# Patient Record
Sex: Male | Born: 1965 | Race: White | Hispanic: No | Marital: Single | Smoking: Never smoker
Health system: Southern US, Community
[De-identification: ages and names within clinical notes are randomized; demographics above are authoritative.]

## PROBLEM LIST (undated history)

## (undated) DIAGNOSIS — F4310 Post-traumatic stress disorder, unspecified: Secondary | ICD-10-CM

## (undated) HISTORY — DX: Post-traumatic stress disorder, unspecified: F43.10

## (undated) HISTORY — PX: OTHER SURGICAL HISTORY: SHX169

---

## 2007-04-09 ENCOUNTER — Emergency Department (HOSPITAL_COMMUNITY): Admission: EM | Admit: 2007-04-09 | Discharge: 2007-04-09 | Payer: Self-pay | Admitting: Emergency Medicine

## 2014-01-29 ENCOUNTER — Encounter: Payer: Self-pay | Admitting: Neurology

## 2014-02-02 ENCOUNTER — Ambulatory Visit (INDEPENDENT_AMBULATORY_CARE_PROVIDER_SITE_OTHER): Payer: BC Managed Care – PPO | Admitting: Neurology

## 2014-02-02 ENCOUNTER — Encounter: Payer: Self-pay | Admitting: Neurology

## 2014-02-02 ENCOUNTER — Encounter (INDEPENDENT_AMBULATORY_CARE_PROVIDER_SITE_OTHER): Payer: Self-pay

## 2014-02-02 VITALS — BP 129/93 | HR 73 | Ht 68.0 in | Wt 186.0 lb

## 2014-02-02 DIAGNOSIS — F070 Personality change due to known physiological condition: Secondary | ICD-10-CM

## 2014-02-02 DIAGNOSIS — F688 Other specified disorders of adult personality and behavior: Secondary | ICD-10-CM | POA: Insufficient documentation

## 2014-02-02 NOTE — Progress Notes (Signed)
Reason for visit: Personality change  Bryce Chen is a 48 y.o. male  History of present illness:  Bryce Chen is a 48 year old right-handed white male with a history of problems with holding down jobs throughout his life. The patient indicates that when he was in school, he was an average student, but he was able to get his work done. Throughout his life, the patient has had difficulty maintaining gainful employment. The patient will get a job, but he leaves it after several months, not seemingly understanding what his responsibilities are for that particular job. The patient will get focused on another issue, and not get critical tasks completed while working. The patient has recently lost another job 6 months ago, and he has been unemployed, and unable to find another job. The patient has had some underlying anxiety issues, and at times he has difficulty with sleeping. The patient denies any memory issues, or problems with recalling names for people. The patient denies any significant difficulties with directions while driving. The patient has not had any problems with math function. The patient reports no focal numbness or weakness of the face, arms, or legs. The patient has no weakness on the body. The patient denies any balance issues or problems controlling the bowels or the bladder. The patient denies fatigue or excessive daytime drowsiness. The father is present today, and he indicates that this issue with his behavior has been lifelong. The patient occasionally will have episodes of anger and rage. The patient is sent to this office for further evaluation.  Past Medical History  Diagnosis Date  . PTSD (post-traumatic stress disorder)     Past Surgical History  Procedure Laterality Date  . None      Family History  Problem Relation Age of Onset  . Cancer - Other Mother     ovarian  . Cancer - Other Father     skin cancer  . Heart failure Sister     Social history:   reports that he has never smoked. He has never used smokeless tobacco. He reports that he does not drink alcohol or use illicit drugs.  Medications:  No current outpatient prescriptions on file prior to visit.   No current facility-administered medications on file prior to visit.     No Known Allergies  ROS:  Out of a complete 14 system review of symptoms, the patient complains only of the following symptoms, and all other reviewed systems are negative.  Skin rash Blurred vision Skin sensitivity Confusion, headache Depression  Blood pressure 129/93, pulse 73, height 5\' 8"  (1.727 m), weight 186 lb (84.369 kg).  Physical Exam  General: The patient is alert and cooperative at the time of the examination.  Eyes: Pupils are equal, round, and reactive to light. Discs are flat bilaterally.  Neck: The neck is supple, no carotid bruits are noted.  Respiratory: The respiratory examination is clear.  Cardiovascular: The cardiovascular examination reveals a regular rate and rhythm, no obvious murmurs or rubs are noted.  Skin: Extremities are without significant edema.  Neurologic Exam  Mental status: The patient is alert and oriented x 3 at the time of the examination. The patient has apparent normal recent and remote memory, with an apparently normal attention span and concentration ability. Mini-Mental status examination done today shows a total score of 29/30.  Cranial nerves: Facial symmetry is present. There is good sensation of the face to pinprick and soft touch bilaterally. The strength of the facial muscles and the  muscles to head turning and shoulder shrug are normal bilaterally. Speech is well enunciated, no aphasia or dysarthria is noted. Extraocular movements are full. Visual fields are full. The tongue is midline, and the patient has symmetric elevation of the soft palate. No obvious hearing deficits are noted.  Motor: The motor testing reveals 5 over 5 strength of all 4  extremities. Good symmetric motor tone is noted throughout.  Sensory: Sensory testing is intact to pinprick, soft touch, vibration sensation, and position sense on all 4 extremities. No evidence of extinction is noted.  Coordination: Cerebellar testing reveals good finger-nose-finger and heel-to-shin bilaterally.  Gait and station: Gait is normal. Tandem gait is normal. Romberg is negative. No drift is seen.  Reflexes: Deep tendon reflexes are symmetric and normal bilaterally. Toes are downgoing bilaterally.   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 It appears that the patient is unable to focus on critical tasks at work, getting side tracked on other issues. The patient may have a very mild form of Asperger syndrome, but the possibility of ADD needs to be considered. The patient appears to have a flat affect, and some underlying depression issues must be considered. I am not clear that a MRI of the brain is indicated given the lifelong, static features of this problem. The patient will be sent for neuropsychological evaluation to further delineate any underlying cognitive issues or executive function disorders.  Marlan Palau. Keith Willis MD 02/02/2014 7:59 PM  [facility] [address] [place], [place] [id number]  Phone [phone] Fax [phone]

## 2014-04-27 DIAGNOSIS — R41844 Frontal lobe and executive function deficit: Secondary | ICD-10-CM

## 2014-05-04 DIAGNOSIS — R41844 Frontal lobe and executive function deficit: Secondary | ICD-10-CM | POA: Diagnosis not present

## 2014-05-28 ENCOUNTER — Telehealth: Payer: Self-pay | Admitting: Neurology

## 2014-05-28 NOTE — Telephone Encounter (Signed)
The neuropsychological testing as been done, showing evidence of nonverbal learning disorder. He was given the name of a psychologist to contact the help with therapy. I called the patient. It appears that DR. Zelson has already discussed the results of the neuropsychological evaluation with him. Hopefully, he'll contact me psychologist that he was referred to, and initiate treatment.

## 2015-01-26 ENCOUNTER — Other Ambulatory Visit: Payer: Self-pay | Admitting: Internal Medicine

## 2015-01-26 DIAGNOSIS — R1310 Dysphagia, unspecified: Secondary | ICD-10-CM

## 2015-02-02 ENCOUNTER — Ambulatory Visit
Admission: RE | Admit: 2015-02-02 | Discharge: 2015-02-02 | Disposition: A | Discharge status: Home / Self Care | Payer: BLUE CROSS/BLUE SHIELD | Source: Ambulatory Visit | Attending: Internal Medicine | Admitting: Internal Medicine

## 2015-02-02 ENCOUNTER — Encounter (INDEPENDENT_AMBULATORY_CARE_PROVIDER_SITE_OTHER): Payer: Self-pay

## 2015-02-02 DIAGNOSIS — R1310 Dysphagia, unspecified: Secondary | ICD-10-CM

## 2016-04-25 IMAGING — RF DG UGI W/ HIGH DENSITY W/KUB
19 of 24 series · 19 of 24 positions shown · non-contrast
Comparison: None.

CLINICAL DATA: Dysphagia.

EXAM:
UPPER GI SERIES WITH KUB
TECHNIQUE: After obtaining a scout radiograph a routine upper GI series was
performed using thin and high density barium.
FLUOROSCOPY TIME:  Radiation Exposure Index (as provided by the
fluoroscopic device): 79 d Gy cm2)
If the device does not provide the exposure index:
Fluoroscopy Time (in minutes and seconds):  1 minutes 54 seconds.
Number of Acquired Images:  5

[Series 1: run · 1 of 1 slices shown (1 of 19)]
[im 1/1]
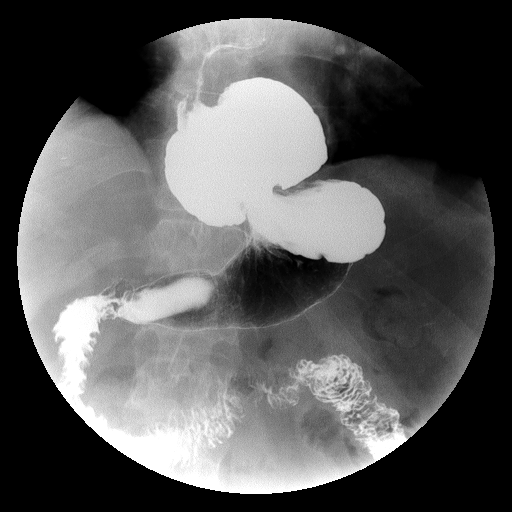

[Series 2: run · 1 of 1 slices shown (2 of 19)]
[im 1/1]
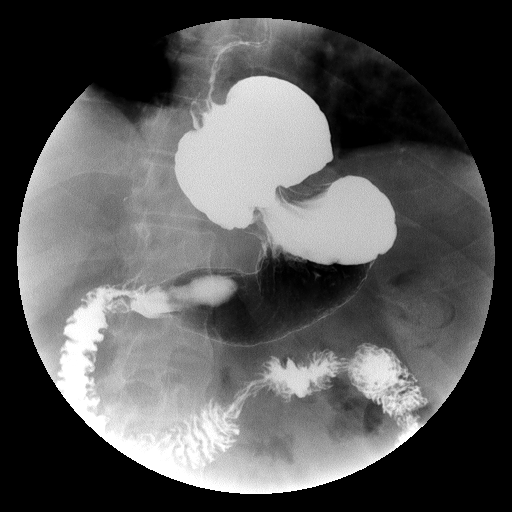

[Series 4: run · 1 of 1 slices shown (3 of 19)]
[im 1/1]
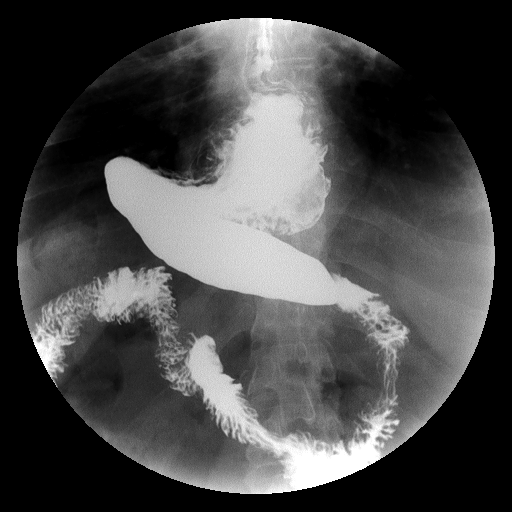

[Series 5: run · 1 of 1 slices shown (4 of 19)]
[im 1/1]
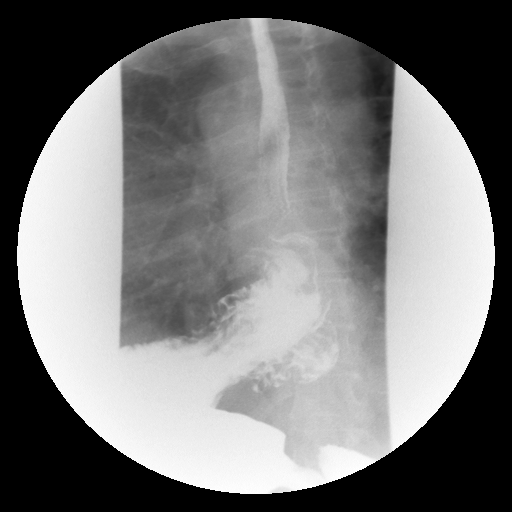

[Series 6: run · 1 of 1 slices shown (5 of 19)]
[im 1/1]
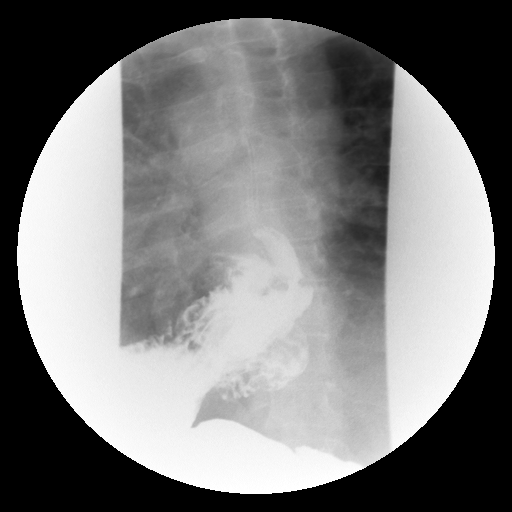

[Series 7: run · 1 of 1 slices shown (6 of 19)]
[im 1/1]
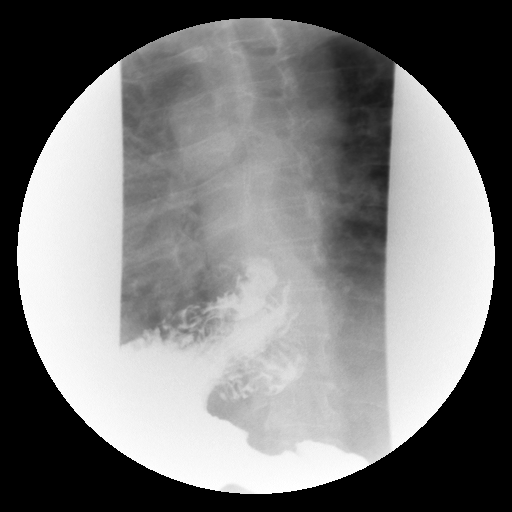

[Series 9: run · 1 of 1 slices shown (7 of 19)]
[im 1/1]
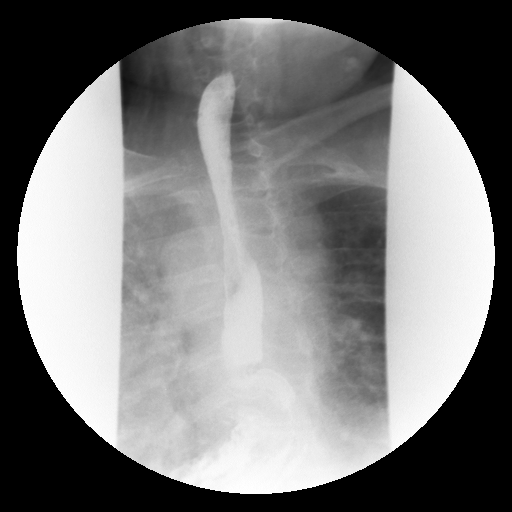

[Series 10: run · 1 of 1 slices shown (8 of 19)]
[im 1/1]
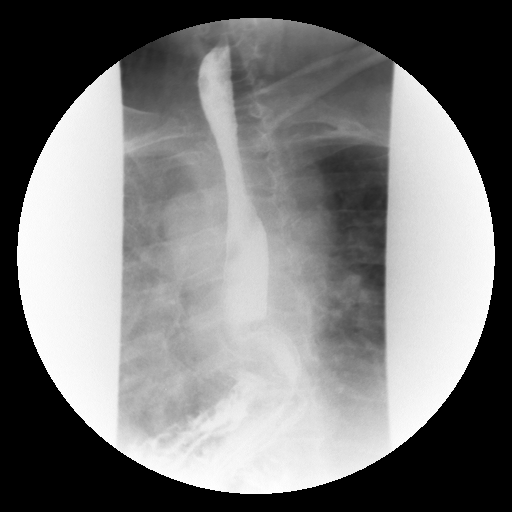

[Series 11: run · 1 of 1 slices shown (9 of 19)]
[im 1/1]
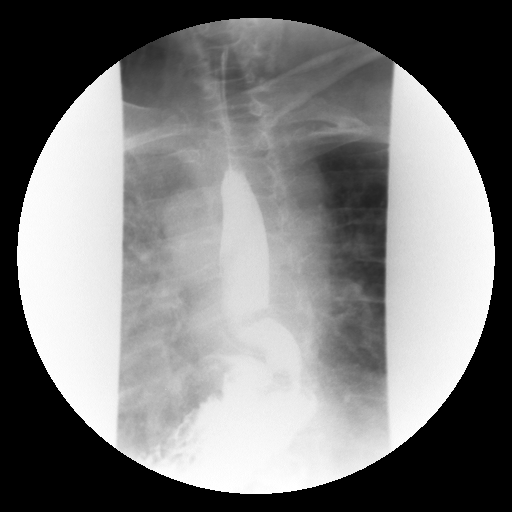

[Series 13: run · 1 of 1 slices shown (10 of 19)]
[im 1/1]
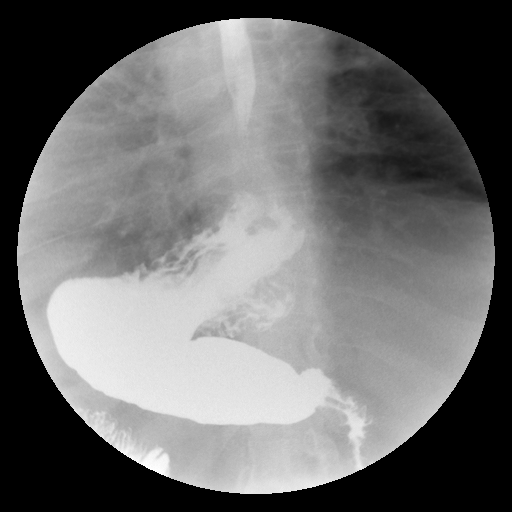

[Series 14: run · 1 of 1 slices shown (11 of 19)]
[im 1/1]
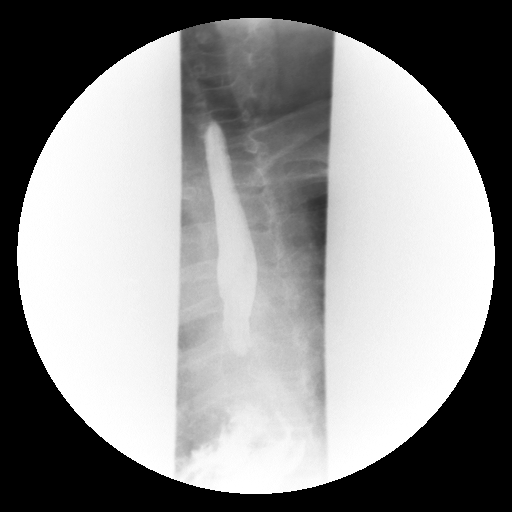

[Series 15: run · 1 of 1 slices shown (12 of 19)]
[im 1/1]
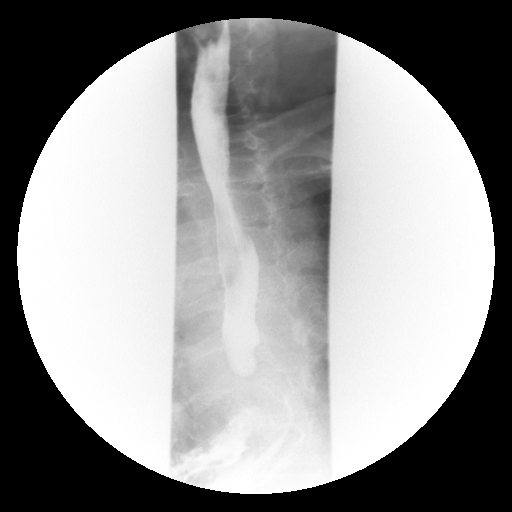

[Series 16: run · 1 of 1 slices shown (13 of 19)]
[im 1/1]
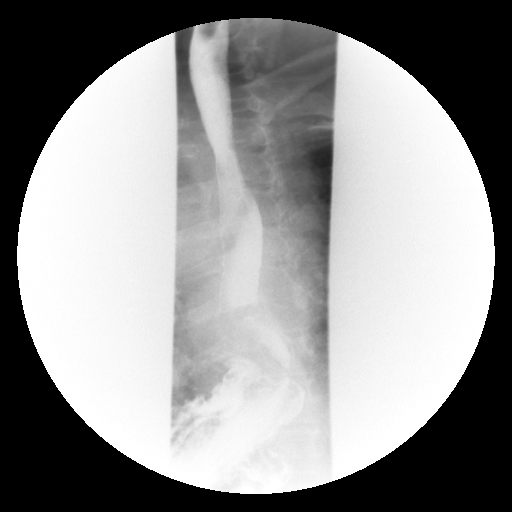

[Series 18: run · 1 of 1 slices shown (14 of 19)]
[im 1/1]
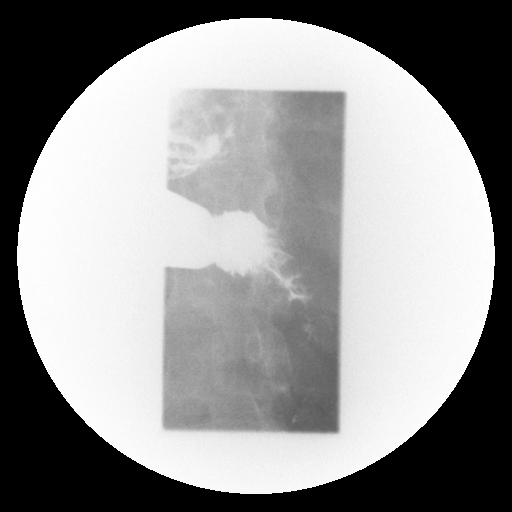

[Series 19: run · 1 of 1 slices shown (15 of 19)]
[im 1/1]
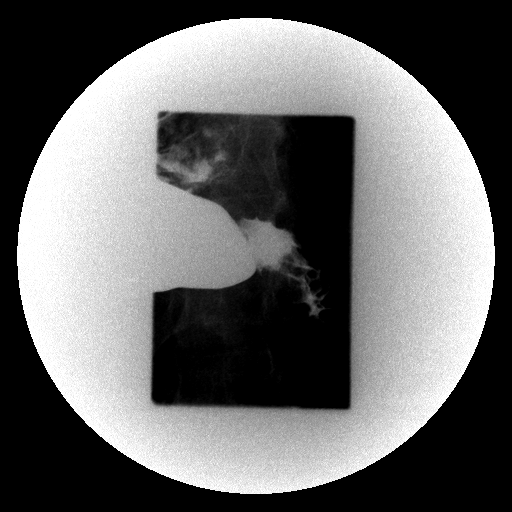

[Series 20: run · 1 of 1 slices shown (16 of 19)]
[im 1/1]
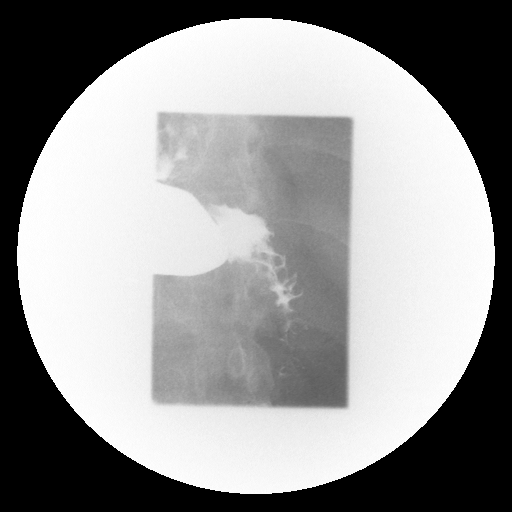

[Series 21: run · 1 of 1 slices shown (17 of 19)]
[im 1/1]
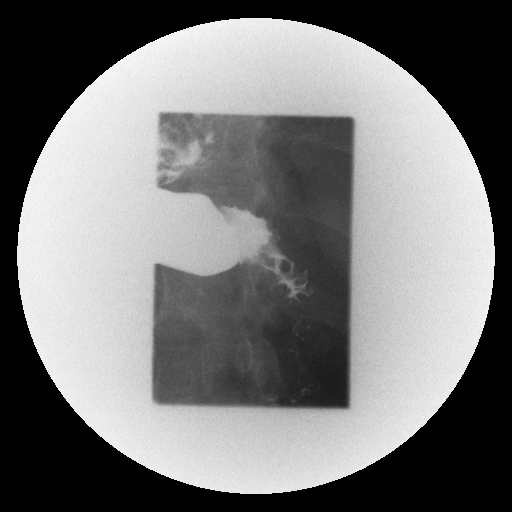

[Series 23: run · 1 of 1 slices shown (18 of 19)]
[im 1/1]
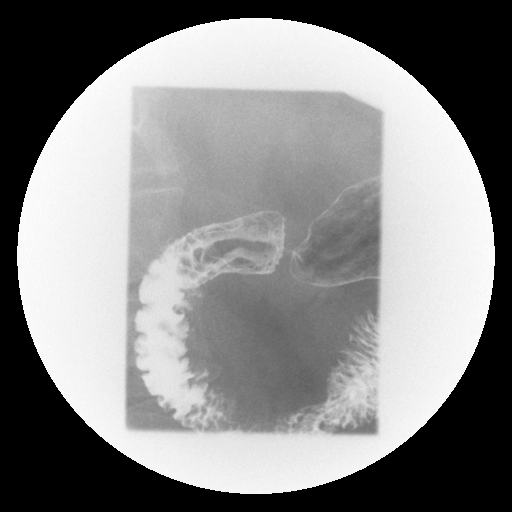

[Series 24: run · 1 of 1 slices shown (19 of 19)]
[im 1/1]
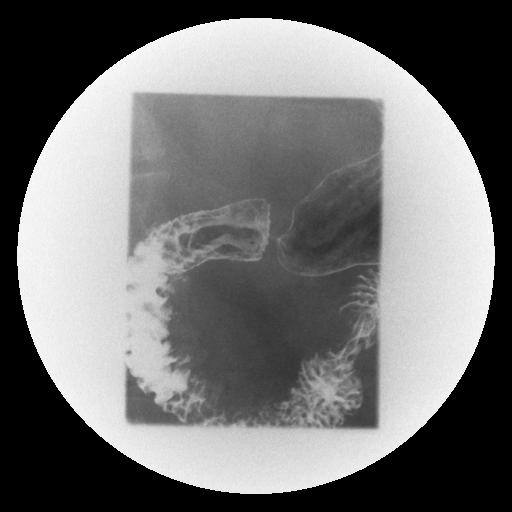

[19 of 24 positions shown; findings below may reference images not displayed]

FINDINGS: Scout view of the abdomen shows a fair amount of stool in the colon.
Bowel gas pattern is otherwise unremarkable.

Double contrast examination of the upper gastrointestinal tract
shows poor esophageal motility with stasis of contrast in the
esophagus. No esophageal fold thickening. There is focal narrowing
at the gastroesophageal junction, through which a 13 mm barium pill
would not pass. Moderate hiatal hernia, containing approximately
half of the stomach. Stomach and duodenum bulb are otherwise
unremarkable.
IMPRESSION: 1. Focal narrowing at the gastroesophageal junction, through which a
13 mm barium pill would not pass.
2. Esophageal dysmotility.
3. Moderate hiatal hernia.
4. Fair amount of stool in the colon is indicative of constipation.

## 2019-07-07 ENCOUNTER — Other Ambulatory Visit: Payer: Self-pay | Admitting: *Deleted

## 2019-07-07 DIAGNOSIS — Z20822 Contact with and (suspected) exposure to covid-19: Secondary | ICD-10-CM

## 2019-07-13 LAB — NOVEL CORONAVIRUS, NAA: SARS-CoV-2, NAA: NOT DETECTED

## 2019-07-16 ENCOUNTER — Telehealth: Payer: Self-pay | Admitting: General Practice

## 2019-07-16 NOTE — Telephone Encounter (Signed)
Negative results was given to pt °
